# Patient Record
Sex: Male | Born: 1955 | Race: Black or African American | Hispanic: No | Marital: Married | Smoking: Current every day smoker
Health system: Southern US, Community
[De-identification: ages and names within clinical notes are randomized; demographics above are authoritative.]

## PROBLEM LIST (undated history)

## (undated) DIAGNOSIS — T7840XA Allergy, unspecified, initial encounter: Secondary | ICD-10-CM

## (undated) HISTORY — DX: Allergy, unspecified, initial encounter: T78.40XA

---

## 1984-03-08 HISTORY — PX: SHOULDER SURGERY: SHX246

## 2014-03-18 ENCOUNTER — Ambulatory Visit (INDEPENDENT_AMBULATORY_CARE_PROVIDER_SITE_OTHER): Payer: BLUE CROSS/BLUE SHIELD | Admitting: Physician Assistant

## 2014-03-18 VITALS — BP 132/82 | HR 60 | Temp 98.4°F | Resp 18 | Ht 67.5 in | Wt 177.0 lb

## 2014-03-18 DIAGNOSIS — G479 Sleep disorder, unspecified: Secondary | ICD-10-CM

## 2014-03-18 DIAGNOSIS — Z1211 Encounter for screening for malignant neoplasm of colon: Secondary | ICD-10-CM

## 2014-03-18 DIAGNOSIS — N508 Other specified disorders of male genital organs: Secondary | ICD-10-CM

## 2014-03-18 DIAGNOSIS — R079 Chest pain, unspecified: Secondary | ICD-10-CM

## 2014-03-18 DIAGNOSIS — Z13220 Encounter for screening for lipoid disorders: Secondary | ICD-10-CM

## 2014-03-18 DIAGNOSIS — M542 Cervicalgia: Secondary | ICD-10-CM

## 2014-03-18 DIAGNOSIS — Z125 Encounter for screening for malignant neoplasm of prostate: Secondary | ICD-10-CM

## 2014-03-18 DIAGNOSIS — R3589 Other polyuria: Secondary | ICD-10-CM

## 2014-03-18 DIAGNOSIS — Z0000 Encounter for general adult medical examination without abnormal findings: Secondary | ICD-10-CM

## 2014-03-18 DIAGNOSIS — H539 Unspecified visual disturbance: Secondary | ICD-10-CM

## 2014-03-18 DIAGNOSIS — Z130 Encounter for screening for diseases of the blood and blood-forming organs and certain disorders involving the immune mechanism: Secondary | ICD-10-CM

## 2014-03-18 DIAGNOSIS — Z6827 Body mass index (BMI) 27.0-27.9, adult: Secondary | ICD-10-CM

## 2014-03-18 DIAGNOSIS — R358 Other polyuria: Secondary | ICD-10-CM

## 2014-03-18 DIAGNOSIS — N5089 Other specified disorders of the male genital organs: Secondary | ICD-10-CM

## 2014-03-18 DIAGNOSIS — Z23 Encounter for immunization: Secondary | ICD-10-CM

## 2014-03-18 LAB — POCT CBC
Granulocyte percent: 62 %G (ref 37–80)
HCT, POC: 44.5 % (ref 43.5–53.7)
HEMOGLOBIN: 14.7 g/dL (ref 14.1–18.1)
LYMPH, POC: 2.9 (ref 0.6–3.4)
MCH: 29.7 pg (ref 27–31.2)
MCHC: 33 g/dL (ref 31.8–35.4)
MCV: 90 fL (ref 80–97)
MID (CBC): 0.2 (ref 0–0.9)
MPV: 8.3 fL (ref 0–99.8)
PLATELET COUNT, POC: 208 10*3/uL (ref 142–424)
POC GRANULOCYTE: 5 (ref 2–6.9)
POC LYMPH PERCENT: 36.1 %L (ref 10–50)
POC MID %: 1.9 %M (ref 0–12)
RBC: 4.95 M/uL (ref 4.69–6.13)
RDW, POC: 14.4 %
WBC: 8.1 10*3/uL (ref 4.6–10.2)

## 2014-03-18 LAB — COMPREHENSIVE METABOLIC PANEL
ALBUMIN: 4.4 g/dL (ref 3.5–5.2)
ALT: 28 U/L (ref 0–53)
AST: 28 U/L (ref 0–37)
Alkaline Phosphatase: 24 U/L — ABNORMAL LOW (ref 39–117)
BUN: 10 mg/dL (ref 6–23)
CHLORIDE: 108 meq/L (ref 96–112)
CO2: 24 mEq/L (ref 19–32)
Calcium: 9 mg/dL (ref 8.4–10.5)
Creat: 1.01 mg/dL (ref 0.50–1.35)
Glucose, Bld: 92 mg/dL (ref 70–99)
Potassium: 4.2 mEq/L (ref 3.5–5.3)
SODIUM: 138 meq/L (ref 135–145)
Total Bilirubin: 0.4 mg/dL (ref 0.2–1.2)
Total Protein: 6.9 g/dL (ref 6.0–8.3)

## 2014-03-18 LAB — LIPID PANEL
Cholesterol: 200 mg/dL (ref 0–200)
HDL: 48 mg/dL (ref >39)
LDL CALC: 137 mg/dL — AB (ref 0–99)
TRIGLYCERIDES: 77 mg/dL (ref <150)
Total CHOL/HDL Ratio: 4.2 Ratio
VLDL: 15 mg/dL (ref 0–40)

## 2014-03-18 LAB — TSH: TSH: 1.879 u[IU]/mL (ref 0.350–4.500)

## 2014-03-18 LAB — GLUCOSE, POCT (MANUAL RESULT ENTRY): POC Glucose: 96 mg/dl (ref 70–99)

## 2014-03-18 MED ORDER — MELOXICAM 15 MG PO TABS: 15.0000 mg | ORAL_TABLET | Freq: Every day | ORAL | Status: DC

## 2014-03-18 NOTE — Patient Instructions (Addendum)
I will contact you with your lab results as soon as they are available.   If you have not heard from me in 2 weeks, please contact me.  The fastest way to get your results is to register for My Chart (see the instructions on the last page of this printout).  The vision in your LEFT eye is diminished. You would benefit from a corrective lens. Please schedule with an eye specialist of your choice.  Seek dental care as soon as you are able. [employer] has a clinic for people without dental coverage. You may qualify for their services.  Keeping you healthy  Get these tests  Blood pressure- Have your blood pressure checked once a year by your healthcare provider.  Normal blood pressure is 120/80  Weight- Have your body mass index (BMI) calculated to screen for obesity.  BMI is a measure of body fat based on height and weight. You can also calculate your own BMI at [email or web address].  Cholesterol- Have your cholesterol checked every year.  Diabetes- Have your blood sugar checked regularly if you have high blood pressure, high cholesterol, have a family history of diabetes or if you are overweight.  Screening for Colon Cancer- Colonoscopy starting at age 40.  Screening may begin sooner depending on your family history and other health conditions. Follow up colonoscopy as directed by your Gastroenterologist.  Screening for Prostate Cancer- Both blood work (PSA) and a rectal exam help screen for Prostate Cancer.  Screening begins at age 59 with African-American men and at age 65 with Caucasian men.  Screening may begin sooner depending on your family history.  Take these medicines  Aspirin- One aspirin daily can help prevent Heart disease and Stroke.  Flu shot- Every fall.  Tetanus- Every 10 years.  Zostavax- Once after the age of 70 to prevent Shingles.  Pneumonia shot- Once after the age of 18; if you are younger than 76, ask your healthcare provider if you need  a Pneumonia shot.  Take these steps  Don't smoke- If you do smoke, talk to your doctor about quitting.  For tips on how to quit, go to www.smokefree.gov or call 1-800-QUIT-NOW.  Be physically active- Exercise 5 days a week for at least 30 minutes.  If you are not already physically active start slow and gradually work up to 30 minutes of moderate physical activity.  Examples of moderate activity include walking briskly, mowing the yard, dancing, swimming, bicycling, etc.  Eat a healthy diet- Eat a variety of healthy food such as fruits, vegetables, low fat milk, low fat cheese, yogurt, lean meant, poultry, fish, beans, tofu, etc. For more information go to www.thenutritionsource.org  Drink alcohol in moderation- Limit alcohol intake to less than two drinks a day. Never drink and drive.  Dentist- Brush and floss twice daily; visit your dentist twice a year.  Depression- Your emotional health is as important as your physical health. If you're feeling down, or losing interest in things you would normally enjoy please talk to your healthcare provider.  Eye exam- Visit your eye doctor every year.  Safe sex- If you may be exposed to a sexually transmitted infection, use a condom.  Seat belts- Seat belts can save your life; always wear one.  Smoke/Carbon Monoxide detectors- These detectors need to be installed on the appropriate level of your home.  Replace batteries at least once a year.  Skin cancer- When out in the sun, cover up and use sunscreen 15 SPF or  higher.  Violence- If anyone is threatening you, please tell your healthcare provider.  Living Will/ Health care power of attorney- Speak with your healthcare provider and family.

## 2014-03-18 NOTE — Progress Notes (Signed)
Subjective:    Patient ID: Mark Cherry, male    DOB: 1956/01/31, 59 y.o.   MRN: [id number]   PCP: No PCP Per Patient  Chief Complaint  Patient presents with  . Annual Exam    No Known Allergies  There are no active problems to display for this patient.   Prior to Admission medications   Not on File    Medical, Surgical, Family and Social History reviewed and updated.  HPI    Review of Systems  Constitutional: Positive for appetite change (i don't eat a lot of sweets, I don't like a lot of salt, I don't eat a lot of stuff like lettuce or broccoli. I like my food cooked. I can't eat a lot. I'm not a big eater.).  HENT: Positive for congestion, dental problem (Bad teeth. I inherited from my mom. one extraction 2 years ago. Can't afford additional work presently.), nosebleeds, rhinorrhea, sinus pressure and sneezing.        I constantly live with that every day. I've tried everything over the counter. After the first couple of days they don't work anymore.  Eyes: Positive for discharge, redness, itching and visual disturbance.       Eyes water a lot. Even with sunglasses. I just don't see as good as I used to. Near vision is blurry ("more than a foot in"). Wears reading glasses.  Respiratory: Positive for chest tightness (once in a while I get needles sticking right close to me where my heart is. for about 6 weeks. No squeezing, pressure, something sitting on him.).   Endocrine: Positive for heat intolerance, polydipsia (I drink a lot of tea. I mainly do that when I get home. It runs through me like water.) and polyuria (everything runs through me real quick.).  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  Allergic/Immunologic: Positive for  environmental allergies.  Neurological: Positive for weakness (RIGHT arm, associated with lots of lifting at work), numbness (RIGHT arm, associated with lots of lifting at work) and headaches (lots of looking up from his reach truck).  Psychiatric/Behavioral: Positive for sleep disturbance (some nights I can sleep good, go to bed early, like 7:30 pm, other nights unable to fall asleep until 3 am, and then I'm up again at 4:30am. 3 of 7 nights he has trouble.).       Objective:   Physical Exam  Constitutional: He is oriented to person, place, and time. Vital signs are normal. He appears well-developed and well-nourished. He is active and cooperative.  Non-toxic appearance. He does not have a sickly appearance. He does not appear ill. No distress.  BP 132/82 mmHg  Pulse 60  Temp(Src) 98.4 F (36.9 C) (Oral)  Resp 18  Ht 5' 7.5" (1.715 m)  Wt 177 lb (80.287 kg)  BMI 27.30 kg/m2  SpO2 98%   HENT:  Head: Normocephalic and atraumatic.  Right Ear: Hearing, tympanic membrane, external ear and ear canal normal.  Left Ear: Hearing, tympanic membrane, external ear and ear canal normal.  Nose: Nose normal.  Mouth/Throat: Uvula is midline, oropharynx is clear and moist and mucous membranes are normal. He does not have dentures. No oral lesions. No trismus in the jaw. Abnormal dentition (upper edentula; remaining lower teeth are in  poor repair). Dental caries present. No dental abscesses, uvula swelling or lacerations.  Eyes: Conjunctivae, EOM and lids are normal. Pupils are equal, round, and reactive to light. Right eye exhibits no discharge. Left eye exhibits no discharge. No scleral icterus.  Fundoscopic exam:      The right eye shows no arteriolar narrowing, no AV nicking, no exudate, no hemorrhage and no papilledema.       The left eye shows no arteriolar narrowing, no AV nicking, no exudate, no hemorrhage and no papilledema.  Visual Acuity in Right Eye - Without correction: 20/20-1  With  correction:  Visual Acuity in Left Eye - Without correction: 20/50  With correction:  Visual Acuity in Both Eyes - Without correction: 20/20-1  With correction:     Neck: Normal range of motion, full passive range of motion without pain and phonation normal. Neck supple. No spinous process tenderness and no muscular tenderness present. No rigidity. No tracheal deviation, no edema, no erythema and normal range of motion present. No thyromegaly present.  Cardiovascular: Normal rate, regular rhythm, S1 normal, S2 normal, normal heart sounds, intact distal pulses and normal pulses.  Exam reveals no gallop and no friction rub.   No murmur heard. Pulmonary/Chest: Effort normal and breath sounds normal. No respiratory distress. He has no wheezes. He has no rales.  Abdominal: Soft. Normal appearance and bowel sounds are normal. He exhibits no distension and no mass. There is no hepatosplenomegaly. There is no tenderness. There is no rebound and no guarding. No hernia. Hernia confirmed negative in the right inguinal area and confirmed negative in the left inguinal area.  Genitourinary: Rectum normal, prostate normal and penis normal. Right testis shows mass (above the RIGHT testicle). Right testis shows no swelling and no tenderness. Right testis is descended. Cremasteric reflex is not absent on the right side. Left testis shows no mass, no swelling and no tenderness. Left testis is descended. Cremasteric reflex is not absent on the left side. Circumcised. No phimosis, paraphimosis, hypospadias, penile erythema or penile tenderness. No discharge found.  Musculoskeletal: Normal range of motion. He exhibits no edema.       Right shoulder: He exhibits tenderness, bony tenderness (anteriorly and laterally to palpation) and spasm. He exhibits normal range of motion, no swelling, no effusion, no crepitus, no deformity, no laceration, no pain, normal pulse and normal strength.       Left shoulder: Normal.       Right  elbow: Normal.      Left elbow: Normal.       Right wrist: Normal.       Left wrist: Normal.       Right hip: Normal.       Left hip: Normal.       Right knee: Normal.       Left knee: Normal.       Right ankle: Normal. Achilles tendon normal.       Left ankle: Normal. Achilles tendon normal.       Cervical back: Normal. He exhibits normal range of motion, no tenderness, no bony tenderness, no swelling, no edema, no deformity, no laceration, no pain, no spasm and normal pulse.       Thoracic back: Normal.       Lumbar back: Normal.       Right upper arm: Normal.       Left upper arm: Normal.       Right forearm: Normal.       Left forearm:  Normal.       Arms:      Right hand: Normal.       Left hand: Normal.       Right upper leg: Normal.       Left upper leg: Normal.       Right lower leg: Normal.       Left lower leg: Normal.       Right foot: Normal.       Left foot: Normal.  Lymphadenopathy:       Head (right side): No submental, no submandibular, no tonsillar, no preauricular, no posterior auricular and no occipital adenopathy present.       Head (left side): No submental, no submandibular, no tonsillar, no preauricular, no posterior auricular and no occipital adenopathy present.    He has no cervical adenopathy.       Right: No inguinal and no supraclavicular adenopathy present.       Left: No inguinal and no supraclavicular adenopathy present.  Neurological: He is alert and oriented to person, place, and time. He has normal strength and normal reflexes. He displays no tremor. No cranial nerve deficit or sensory deficit. He exhibits normal muscle tone. Coordination and gait normal.  Skin: Skin is warm, dry and intact. No abrasion, no ecchymosis, no laceration, no lesion and no rash noted. He is not diaphoretic. No cyanosis or erythema. No pallor. Nails show no clubbing.  Psychiatric: He has a normal mood and affect. His speech is normal and behavior is normal. Judgment and  thought content normal. Cognition and memory are normal.      Results for orders placed or performed in visit on 03/18/14  POCT CBC  Result Value Ref Range   WBC 8.1 4.6 - 10.2 K/uL   Lymph, poc 2.9 0.6 - 3.4   POC LYMPH PERCENT 36.1 10 - 50 %L   MID (cbc) 0.2 0 - 0.9   POC MID % 1.9 0 - 12 %M   POC Granulocyte 5.0 2 - 6.9   Granulocyte percent 62.0 37 - 80 %G   RBC 4.95 4.69 - 6.13 M/uL   Hemoglobin 14.7 14.1 - 18.1 g/dL   HCT, POC 62.144.5 30.843.5 - 53.7 %   MCV 90.0 80 - 97 fL   MCH, POC 29.7 27 - 31.2 pg   MCHC 33.0 31.8 - 35.4 g/dL   RDW, POC 65.714.4 %   Platelet Count, POC 208 142 - 424 K/uL   MPV 8.3 0 - 99.8 fL  POCT glucose (manual entry)  Result Value Ref Range   POC Glucose 96 70 - 99 mg/dl   EKG reviewed with Dr. Merla Richesoolittle. Sinus bradycardia with a rate of 50. Low voltage, probably normal.    Assessment & Plan:  1. Annual physical exam Age appropriate anticipatory guidance provided.  2. BMI 27.0-27.9,adult Healthy eating and regular exercise.  3. Screening for prostate cancer - PSA  4. Screening for colon cancer - Ambulatory referral to Gastroenterology  5. Screening for iron deficiency anemia Normal CBC. - POCT CBC  6. Screening for hyperlipidemia Await lab results. Healthy eating, regular exercise. - Lipid panel  7. Chest pain, unspecified chest pain type Smoker, over 50. While this CP isn't typical for cardiac etiologies, recommend cardiology evaluation  - EKG 12-Lead - Ambulatory referral to Cardiology  8. Polyuria No evidence of infection or diabetes. Await CMET. Consider BPH causing symptoms. - POCT glucose (manual entry) - Comprehensive metabolic panel  9. Neck pain Trial of meloxicam.  Re-evaluate in 6 weeks. Plan C-spine films due to RIGHT arm paresthesias. - meloxicam (MOBIC) 15 MG tablet; Take 1 tablet (15 mg total) by mouth daily.  Dispense: 30 tablet; Refill: 1  10. Vision changes Reduced far vision on the LEFT. Recommend eye specialist  evaluation.  11. Need for Tdap vaccination - Tdap vaccine greater than or equal to 7yo IM  12. Scrotal mass Likely benign. - US Scrotum; Future  13. Sleep disturbance Await TSH. Consider PRN sleep aid if TSH normal. - TSH   Fernande Bras, PA-C Physician Assistant-Certified [facility]

## 2014-03-19 LAB — PSA: PSA: 0.56 ng/mL (ref <=4.00)

## 2014-03-20 ENCOUNTER — Encounter: Payer: Self-pay | Admitting: Physician Assistant

## 2014-03-21 ENCOUNTER — Other Ambulatory Visit: Payer: Self-pay

## 2014-03-25 ENCOUNTER — Ambulatory Visit
Admission: RE | Admit: 2014-03-25 | Discharge: 2014-03-25 | Disposition: A | Discharge status: Home / Self Care | Payer: BLUE CROSS/BLUE SHIELD | Source: Ambulatory Visit | Attending: Physician Assistant | Admitting: Physician Assistant

## 2014-03-25 DIAGNOSIS — N5089 Other specified disorders of the male genital organs: Secondary | ICD-10-CM

## 2014-04-15 NOTE — Progress Notes (Signed)
Patient ID: Mark Cherry, male   DOB: [birth year], 57 y.o.   MRN: [id number]    Cardiology Office Note   Date:  04/16/2014   ID:  Mark Cherry, DOB 09-Aug-1955, MRN [id number]  PCP:  No PCP Per Patient  Cardiologist:   Charlton Haws, MD   No chief complaint on file.     History of Present Illness: Mark Cherry is a 59 y.o. male who presents for  Atypical chest pain by PA Chelle Seen for annual on 03/18/14.  Complained of chest tightness (once in a while I get needles sticking right close to me where my heart is. for about 6 weeks. 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  No previoius CAD  Not getting pain outside of coughing.  Mild exertional dyspnea  Needs CXR    LABS 03/18/14  LDL 137 normal LFT;s Cr PSA and TSH      Past Medical History  Diagnosis Date  . Allergy     Past Surgical History  Procedure Laterality Date  . Shoulder surgery Left 1986    reconstruction after traumatic injury     Current Outpatient Prescriptions  Medication Sig Dispense Refill  . meloxicam (MOBIC) 15 MG tablet Take 1 tablet (15 mg total) by mouth daily. 30 tablet 1   No current facility-administered medications for this visit.    Allergies:   Review of patient's allergies indicates no known allergies.    Social History:  The patient  reports that he has been smoking Cigarettes.  He has a 15 pack-year smoking history. He has never used smokeless tobacco. He reports that he drinks about 1.8 oz of alcohol per week. He reports that he does not use illicit drugs.   Family History:  The patient's family history includes Arthritis in his brother and brother; Diabetes in his brother, maternal grandfather, and mother; Gout in his brother; Hyperlipidemia in his brother;  Hypertension in his brother; Sickle cell anemia in his brother, sister, and sister.    ROS:  Please see the history of present illness.   Otherwise, review of systems are positive for none.   All other systems are reviewed and negative.    PHYSICAL EXAM: VS:  BP 110/70 mmHg  Pulse 56  Ht  (1.702 m)  Wt 80.346 kg (177 lb 2.1 oz)  BMI 27.74 kg/m2  SpO2 97% , BMI Body mass index is 27.74 kg/(m^2). GEN: Well nourished, well developed, in no acute distress HEENT: normal Neck: no JVD, carotid bruits, or masses Cardiac: RRR; no murmurs, rubs, or gallops,no edema  Respiratory:  clear to auscultation bilaterally, normal work of breathing GI: soft, nontender, nondistended, + BS MS: no deformity or atrophy Skin: warm and dry, no rash Neuro:  Strength and sensation are intact Psych: euthymic mood, full affect   EKG:  03/18/14  SR rate 50  Early repol precordial leads insig Q waves inferior leads  04/16/14  SR rate 56  RAD    Recent Labs: 03/18/2014: ALT 28; BUN 10; Creatinine 1.01; Hemoglobin 14.7; Potassium 4.2; Sodium 138; TSH 1.879    Lipid Panel    Component Value Date/Time   CHOL 200 03/18/2014 1100   TRIG 77 03/18/2014 1100   HDL 48 03/18/2014 1100  CHOLHDL 4.2 03/18/2014 1100   VLDL 15 03/18/2014 1100   LDLCALC 137* 03/18/2014 1100      Wt Readings from Last 3 Encounters:  04/16/14 80.346 kg (177 lb 2.1 oz)  03/18/14 80.287 kg (177 lb)      Other studies Reviewed: Additional studies/ records that were reviewed today include: Epic records.    ASSESSMENT AND PLAN:  1.  Chest Pain:  Atypical related to cough  CRF;s including smoking ECG normal F/U ETT since he uses heavy machinery at work 2. Smoking counseled for less than 10 minutes  CXR today   3.  Sinus:  Gave him name of Dr Jearld FentonByers and Annalee GentaShoemaker Discussed Nettie Pot saline washes.  Has f/u with primary in [place]    Current medicines are reviewed at length with the patient today.  The patient does not have  concerns regarding medicines.  The following changes have been made:  no change  Labs/ tests ordered today include: ETT CXR  Orders Placed This Encounter  Procedures  . DG Chest 2 View  . EKG 12-Lead  . Exercise Tolerance Test     Disposition:   FU with me PRN   Signed, Charlton HawsPeter Mikaylee Arseneau, MD  04/16/2014 12:00 PM    [facility] [address], [place], [place]  [id number] Phone: [phone]; Fax: [phone]

## 2014-04-16 ENCOUNTER — Ambulatory Visit (INDEPENDENT_AMBULATORY_CARE_PROVIDER_SITE_OTHER): Payer: BLUE CROSS/BLUE SHIELD | Admitting: Cardiovascular Disease

## 2014-04-16 ENCOUNTER — Ambulatory Visit (INDEPENDENT_AMBULATORY_CARE_PROVIDER_SITE_OTHER)
Admission: RE | Admit: 2014-04-16 | Discharge: 2014-04-16 | Disposition: A | Discharge status: Home / Self Care | Payer: BLUE CROSS/BLUE SHIELD | Source: Ambulatory Visit | Attending: Cardiovascular Disease | Admitting: Cardiovascular Disease

## 2014-04-16 ENCOUNTER — Encounter: Payer: Self-pay | Admitting: Cardiovascular Disease

## 2014-04-16 VITALS — BP 110/70 | HR 56 | Ht 67.0 in | Wt 177.1 lb

## 2014-04-16 DIAGNOSIS — Z720 Tobacco use: Secondary | ICD-10-CM

## 2014-04-16 DIAGNOSIS — F17200 Nicotine dependence, unspecified, uncomplicated: Secondary | ICD-10-CM

## 2014-04-16 DIAGNOSIS — R079 Chest pain, unspecified: Secondary | ICD-10-CM

## 2014-04-16 NOTE — Patient Instructions (Signed)
Your physician recommends that you schedule a follow-up appointment in: AS NEEDED  Your physician recommends that you continue on your current medications as directed. Please refer to the Current Medication list given to you today. A chest x-ray takes a picture of the organs and structures inside the chest, including the heart, lungs, and blood vessels. This test can show several things, including, whether the heart is enlarges; whether fluid is building up in the lungs; and whether pacemaker / defibrillator leads are still in place. Your physician has requested that you have an exercise tolerance test. For further information please visit [email or web address]. Please also follow instruction sheet, as given.  

## 2014-05-16 ENCOUNTER — Encounter: Payer: BLUE CROSS/BLUE SHIELD | Admitting: Physician Assistant

## 2014-06-13 ENCOUNTER — Telehealth (HOSPITAL_COMMUNITY): Payer: Self-pay

## 2014-06-13 NOTE — Telephone Encounter (Signed)
Encounter complete. 

## 2014-06-18 ENCOUNTER — Inpatient Hospital Stay (HOSPITAL_COMMUNITY): Admission: RE | Admit: 2014-06-18 | Payer: BLUE CROSS/BLUE SHIELD | Source: Ambulatory Visit

## 2014-11-18 DIAGNOSIS — J302 Other seasonal allergic rhinitis: Secondary | ICD-10-CM | POA: Insufficient documentation

## 2014-11-18 DIAGNOSIS — J3089 Other allergic rhinitis: Secondary | ICD-10-CM

## 2015-07-28 DIAGNOSIS — M75121 Complete rotator cuff tear or rupture of right shoulder, not specified as traumatic: Secondary | ICD-10-CM | POA: Insufficient documentation

## 2016-04-04 ENCOUNTER — Encounter (HOSPITAL_BASED_OUTPATIENT_CLINIC_OR_DEPARTMENT_OTHER): Payer: Self-pay | Admitting: Emergency Medicine

## 2016-04-04 ENCOUNTER — Emergency Department (HOSPITAL_BASED_OUTPATIENT_CLINIC_OR_DEPARTMENT_OTHER)
Admission: EM | Admit: 2016-04-04 | Discharge: 2016-04-05 | Disposition: A | Discharge status: Home / Self Care | Payer: BLUE CROSS/BLUE SHIELD | Attending: Emergency Medicine | Admitting: Emergency Medicine

## 2016-04-04 ENCOUNTER — Emergency Department (HOSPITAL_BASED_OUTPATIENT_CLINIC_OR_DEPARTMENT_OTHER): Payer: BLUE CROSS/BLUE SHIELD

## 2016-04-04 DIAGNOSIS — M791 Myalgia: Secondary | ICD-10-CM | POA: Insufficient documentation

## 2016-04-04 DIAGNOSIS — R05 Cough: Secondary | ICD-10-CM | POA: Diagnosis present

## 2016-04-04 DIAGNOSIS — R509 Fever, unspecified: Secondary | ICD-10-CM | POA: Insufficient documentation

## 2016-04-04 DIAGNOSIS — R0981 Nasal congestion: Secondary | ICD-10-CM | POA: Insufficient documentation

## 2016-04-04 DIAGNOSIS — F17210 Nicotine dependence, cigarettes, uncomplicated: Secondary | ICD-10-CM | POA: Insufficient documentation

## 2016-04-04 DIAGNOSIS — J111 Influenza due to unidentified influenza virus with other respiratory manifestations: Secondary | ICD-10-CM

## 2016-04-04 DIAGNOSIS — R51 Headache: Secondary | ICD-10-CM | POA: Insufficient documentation

## 2016-04-04 DIAGNOSIS — Z79899 Other long term (current) drug therapy: Secondary | ICD-10-CM | POA: Diagnosis not present

## 2016-04-04 DIAGNOSIS — R69 Illness, unspecified: Secondary | ICD-10-CM

## 2016-04-04 MED ORDER — BENZONATATE 100 MG PO CAPS: 100.0000 mg | ORAL_CAPSULE | Freq: Three times a day (TID) | ORAL | 0 refills | Status: DC

## 2016-04-04 MED ORDER — PROCHLORPERAZINE MALEATE 10 MG PO TABS: 10.0000 mg | ORAL_TABLET | Freq: Two times a day (BID) | ORAL | 0 refills | Status: DC | PRN

## 2016-04-04 NOTE — ED Triage Notes (Signed)
Patient reports that he has had dry cough with body aches x 2 days. He has a headache with the cough

## 2016-04-04 NOTE — ED Provider Notes (Signed)
[facility] Provider Note   CSN: [id number] Arrival date & time: 04/04/16  1842  By signing my name below, I, Modena JanskyAlbert Thayil, attest that this documentation has been prepared under the direction and in the presence of Alvira MondayErin Steffani Dionisio, MD. Electronically Signed: Modena JanskyAlbert Thayil, Scribe. 04/04/2016. 8:12 PM.  History   Chief Complaint Chief Complaint  Patient presents with  . Cough   The history is provided by the patient. No language interpreter was used.   HPI Comments: Mark Cherry is a 61 y.o. male with a PMHx of sinus problems who presents to the Emergency Department complaining of intermittent moderate cough that started today. He took cough syrup, nyquil, and amoxicillin without relief. He describes the cough as dry. He reports associated symptoms of right frontal headache (worse with cough), sinus pain/pressure, nasal congestion, sore throat, generalized myalgias, and subjective fever. Pt's temperature in the ED today was 99.5. He denies any SOB, nausea, vomiting, diarrhea, or visual disturbance.   Past Medical History:  Diagnosis Date  . Allergy     There are no active problems to display for this patient.   Past Surgical History:  Procedure Laterality Date  . SHOULDER SURGERY Left 1986   reconstruction after traumatic injury       Home Medications    Prior to Admission medications   Medication Sig Start Date End Date Taking? Authorizing Provider  dextromethorphan-guaiFENesin (MUCINEX DM) 30-600 MG 12hr tablet Take 1 tablet by mouth 2 (two) times daily.   Yes Historical Provider, MD  benzonatate (TESSALON) 100 MG capsule Take 1 capsule (100 mg total) by mouth every 8 (eight) hours. 04/04/16   Alvira MondayErin Eleen Litz, MD  meloxicam (MOBIC) 15 MG tablet Take 1 tablet (15 mg total) by mouth daily. 03/18/14   Chelle Jeffery, PA-C  prochlorperazine (COMPAZINE) 10 MG tablet Take 1 tablet (10 mg total) by mouth 2 (two) times daily as needed for nausea or vomiting  (headache.  Take with 25mg  of benadryl to help with side effects.). 04/04/16   Alvira MondayErin Jamie Hafford, MD    Family History Family History  Problem Relation Age of Onset  . Diabetes Mother   . Diabetes Brother   . Hyperlipidemia Brother   . Gout Brother   . Hypertension Brother   . Diabetes Maternal Grandfather   . Sickle cell anemia Sister   . Arthritis Brother   . Arthritis Brother   . Sickle cell anemia Brother   . Sickle cell anemia Sister     Social History Social History  Substance Use Topics  . Smoking status: Current Every Day Smoker    Packs/day: 0.50    Years: 30.00    Types: Cigarettes  . Smokeless tobacco: Never Used     Comment: "I've been working on it, but it's the job I have."  . Alcohol use 1.8 oz/week    3 Standard drinks or equivalent per week     Allergies   Patient has no known allergies.   Review of Systems Review of Systems  Constitutional: Positive for fever (Subjective).  HENT: Positive for congestion (Nasal), sinus pain and sinus pressure. Negative for sore throat.   Eyes: Negative for visual disturbance.  Respiratory: Negative for shortness of breath.   Cardiovascular: Negative for chest pain.  Gastrointestinal: Negative for abdominal pain, diarrhea, nausea and vomiting.  Genitourinary: Negative for difficulty urinating.  Musculoskeletal: Positive for myalgias (Generalized). Negative for back pain and neck stiffness.  Skin: Negative for rash.  Neurological: Positive for headaches (Right frontal). Negative  for syncope.     Physical Exam Updated Vital Signs BP 139/79 (BP Location: Right Arm)   Pulse 78   Temp 99.5 F (37.5 C) (Oral)   Resp 21   SpO2 98%   Physical Exam  Constitutional: He is oriented to person, place, and time. He appears well-developed and well-nourished. No distress.  HENT:  Head: Normocephalic and atraumatic.  Right Ear: Tympanic membrane normal.  Left Ear: Tympanic membrane normal.  No sinus TTP. Nasal discharge.  Small clear effusion r TM  Eyes: Conjunctivae and EOM are normal. Pupils are equal, round, and reactive to light.  Neck: Normal range of motion. Neck supple.  Cardiovascular: Normal rate, regular rhythm, normal heart sounds and intact distal pulses.  Exam reveals no gallop and no friction rub.   No murmur heard. Pulmonary/Chest: Effort normal and breath sounds normal. No respiratory distress. He has no wheezes. He has no rales.  Abdominal: Soft. He exhibits no distension. There is no tenderness. There is no guarding.  Musculoskeletal: Normal range of motion. He exhibits no edema.  Neurological: He is alert and oriented to person, place, and time.  Skin: Skin is warm and dry. He is not diaphoretic.  Psychiatric: He has a normal mood and affect.  Nursing note and vitals reviewed.    ED Treatments / Results  DIAGNOSTIC STUDIES: Oxygen Saturation is 98% on RA, normal by my interpretation.    COORDINATION OF CARE: 8:16 PM- Pt advised of plan for treatment and pt agrees.  Labs (all labs ordered are listed, but only abnormal results are displayed) Labs Reviewed - No data to display  EKG  EKG Interpretation None       Radiology Dg Chest 2 View  Result Date: 04/04/2016 CLINICAL DATA:  Dry cough and body aches 2 days. EXAM: CHEST  2 VIEW COMPARISON:  04/16/2014 FINDINGS: Lungs are adequately inflated without focal consolidation or effusion. Cardiomediastinal silhouette and remainder the exam is unchanged. IMPRESSION: No active cardiopulmonary disease. Electronically Signed   By: Elberta Fortis M.D.   On: 04/04/2016 19:06    Procedures Procedures (including critical care time)  Medications Ordered in ED Medications - No data to display   Initial Impression / Assessment and Plan / ED Course  I have reviewed the triage vital signs and the nursing notes.  Pertinent labs & imaging results that were available during my care of the patient were reviewed by me and considered in my  medical decision making (see chart for details).     61yo male with history of smoking presents with concern for cough, congestion, subjective fever, boy aches, headache.  No sign of meningitis. No sign of bacterial sinusitis by duration, exam. Doubt SAH/subdural by hx.  CXR without pneumonia.  Suspect influenza by history. Discussed possible tamiflu, however given side effects/cost, pt without other medical problems, pt declines and will focus on supportive care. Gave compazine rx for headache, tessalon for cough. Patient discharged in stable condition with understanding of reasons to return.   Final Clinical Impressions(s) / ED Diagnoses   Final diagnoses:  Influenza-like illness    New Prescriptions Discharge Medication List as of 04/04/2016  8:28 PM    START taking these medications   Details  prochlorperazine (COMPAZINE) 10 MG tablet Take 1 tablet (10 mg total) by mouth 2 (two) times daily as needed for nausea or vomiting (headache.  Take with 25mg  of benadryl to help with side effects.)., Starting Sun 04/04/2016, Print       I personally  performed the services described in this documentation, which was scribed in my presence. The recorded information has been reviewed and is accurate.    Alvira Monday, MD 04/05/16 1324

## 2016-04-25 DIAGNOSIS — E7800 Pure hypercholesterolemia, unspecified: Secondary | ICD-10-CM | POA: Insufficient documentation

## 2017-03-22 ENCOUNTER — Ambulatory Visit (INDEPENDENT_AMBULATORY_CARE_PROVIDER_SITE_OTHER): Payer: BLUE CROSS/BLUE SHIELD | Admitting: Physician Assistant

## 2017-03-22 ENCOUNTER — Other Ambulatory Visit: Payer: Self-pay

## 2017-03-22 ENCOUNTER — Encounter: Payer: Self-pay | Admitting: Physician Assistant

## 2017-03-22 VITALS — BP 120/84 | HR 78 | Temp 98.8°F | Resp 18 | Ht 67.0 in | Wt 178.4 lb

## 2017-03-22 DIAGNOSIS — E7800 Pure hypercholesterolemia, unspecified: Secondary | ICD-10-CM | POA: Diagnosis not present

## 2017-03-22 DIAGNOSIS — R195 Other fecal abnormalities: Secondary | ICD-10-CM | POA: Diagnosis not present

## 2017-03-22 DIAGNOSIS — Z130 Encounter for screening for diseases of the blood and blood-forming organs and certain disorders involving the immune mechanism: Secondary | ICD-10-CM | POA: Diagnosis not present

## 2017-03-22 DIAGNOSIS — Z1389 Encounter for screening for other disorder: Secondary | ICD-10-CM

## 2017-03-22 DIAGNOSIS — F17200 Nicotine dependence, unspecified, uncomplicated: Secondary | ICD-10-CM | POA: Diagnosis not present

## 2017-03-22 DIAGNOSIS — Z13228 Encounter for screening for other metabolic disorders: Secondary | ICD-10-CM | POA: Diagnosis not present

## 2017-03-22 DIAGNOSIS — Z125 Encounter for screening for malignant neoplasm of prostate: Secondary | ICD-10-CM

## 2017-03-22 DIAGNOSIS — Z1329 Encounter for screening for other suspected endocrine disorder: Secondary | ICD-10-CM | POA: Diagnosis not present

## 2017-03-22 DIAGNOSIS — Z1159 Encounter for screening for other viral diseases: Secondary | ICD-10-CM | POA: Diagnosis not present

## 2017-03-22 DIAGNOSIS — Z114 Encounter for screening for human immunodeficiency virus [HIV]: Secondary | ICD-10-CM | POA: Diagnosis not present

## 2017-03-22 DIAGNOSIS — Z0000 Encounter for general adult medical examination without abnormal findings: Secondary | ICD-10-CM | POA: Diagnosis not present

## 2017-03-22 DIAGNOSIS — Z23 Encounter for immunization: Secondary | ICD-10-CM

## 2017-03-22 NOTE — Progress Notes (Signed)
Patient ID: Mark Cherry Arechiga, male    DOB: 01/10/1956, 62 y.o.   MRN: [id number]  PCP: Patient, No Pcp Per  Chief Complaint  Patient presents with  . Annual Exam    Subjective:   [facility]. He is new to this practice. Chart reviewed, including Care Everywhere. Last wellness visit 02/2015.  Colorectal Cancer Screening: POSITIVE fecal occult test in 2016 and was referred to GI for colonoscopy, but he never scheduled. Prostate Cancer Screening: normal 03/18/2014 Bone Density Testing: not yet a candidate HIV Screening: today STI Screening: declines Seasonal Influenza Vaccination: declines, because when he received it in 2000, he was ill for 2 weeks following the dose. Td/Tdap Vaccination: 03/18/2014 Pneumococcal Vaccination: 02/25/2015, pneumovax 23. Booster dose at age 62. Zoster Vaccination: not yet a candidate Frequency of Dental evaluation: not recently, due to lack of dental coverage. Plans to schedule soon. Frequency of Eye evaluation: >12 months ago   Patient Active Problem List   Diagnosis Date Noted  . Occult blood in stools 03/22/2017  . Complete tear of right rotator cuff 07/28/2015  . Perennial allergic rhinitis with seasonal variation 11/18/2014    Past Medical History:  Diagnosis Date  . Allergy      Prior to Admission medications   Medication Sig Start Date End Date Taking? Authorizing Provider  fluticasone (FLONASE) 50 MCG/ACT nasal spray 1 spray by Each Nare route daily.   Yes [provider]  benzonatate (TESSALON) 100 MG capsule Take 1 capsule (100 mg total) by mouth every 8 (eight) hours. Patient not taking: Reported on 03/22/2017 04/04/16   Alvira MondaySchlossman, Erin, MD  dextromethorphan-guaiFENesin [facility](MUCINEX DM) 30-600 MG 12hr tablet Take 1 tablet by mouth 2 (two) times daily.    [provider]  meloxicam (MOBIC) 15 MG tablet Take 1 tablet (15 mg total) by mouth daily. Patient not taking: Reported on 03/22/2017 03/18/14    Porfirio OarJeffery, Larina Lieurance, PA-C  prochlorperazine (COMPAZINE) 10 MG tablet Take 1 tablet (10 mg total) by mouth 2 (two) times daily as needed for nausea or vomiting (headache.  Take with 25mg  of benadryl to help with side effects.). Patient not taking: Reported on 03/22/2017 04/04/16   Alvira MondaySchlossman, Erin, MD    No Known Allergies  Past Surgical History:  Procedure Laterality Date  . SHOULDER SURGERY Left 1986   reconstruction after traumatic injury    Family History  Problem Relation Age of Onset  . Diabetes Mother   . Diabetes Brother   . Hyperlipidemia Brother   . Gout Brother   . Hypertension Brother   . Diabetes Maternal Grandfather   . Sickle cell anemia Sister   . Arthritis Brother   . Arthritis Brother   . Sickle cell anemia Brother   . Sickle cell anemia Sister     Social History   Socioeconomic History  . Marital status: Legally Separated    Spouse name: n/a  . Number of children: 4  . Years of education: 12th grade  . Highest education level: None  Social Needs  . Financial resource strain: None  . Food insecurity - worry: None  . Food insecurity - inability: None  . Transportation needs - medical: None  . Transportation needs - non-medical: None  Occupational History  . Occupation: Estate agentforklift operator  Tobacco Use  . Smoking status: Current Every Day Smoker    Packs/day: 0.50    Years: 30.00    Pack years: 15.00    Types: Cigarettes  . Smokeless tobacco: Never  Used  . Tobacco comment: "I've been working on it, but it's the job I have."  Substance and Sexual Activity  . Alcohol use: Yes    Alcohol/week: 1.8 oz    Types: 3 Standard drinks or equivalent per week  . Drug use: No  . Sexual activity: Yes    Partners: Female  Other Topics Concern  . None  Social History Narrative   Lives with his fiance.   His younger son lives with him sometimes/intermittently.   Eldest son is currently incarcerated.   One daughter lives with her mother.   One daughter  independently in [facility].   He has 7 grandchildren.       Review of Systems  Constitutional: Negative.   HENT: Positive for congestion (uses Flonase). Negative for dental problem, drooling, ear discharge, ear pain, facial swelling, hearing loss, mouth sores, nosebleeds, postnasal drip, rhinorrhea, sinus pressure, sinus pain, sneezing, sore throat, tinnitus, trouble swallowing and voice change.   Eyes: Positive for itching (uses clear eyes tears with relief). Negative for photophobia, pain, discharge, redness and visual disturbance.  Respiratory: Negative.   Cardiovascular: Negative.   Gastrointestinal: Negative.   Endocrine: Negative.   Genitourinary: Positive for frequency. Negative for decreased urine volume, difficulty urinating, discharge, dysuria, enuresis, flank pain, genital sores, hematuria, penile pain, penile swelling, scrotal swelling, testicular pain and urgency.  Musculoskeletal: Negative.        Muscle cramps at work in hot environment.  Skin: Negative.   Allergic/Immunologic: Negative.   Neurological: Negative.   Hematological: Negative.   Psychiatric/Behavioral: Negative.         Objective:  Physical Exam  Constitutional: He is oriented to person, place, and time. He appears well-developed and well-nourished. He is active and cooperative.  Non-toxic appearance. He does not have a sickly appearance. He does not appear ill. No distress.  BP 120/84 (BP Location: Left Arm, Patient Position: Sitting, Cuff Size: Normal)   Pulse 78   Temp 98.8 F (37.1 C) (Oral)   Resp 18   Ht 5\' 7"  (1.702 m)   Wt 178 lb 6.4 oz (80.9 kg)   SpO2 98%   BMI 27.94 kg/m    HENT:  Head: Normocephalic and atraumatic.  Right Ear: Hearing, tympanic membrane, external ear and ear canal normal.  Left Ear: Hearing, tympanic membrane, external ear and ear canal normal.  Nose: Nose normal.  Mouth/Throat: Uvula is midline, oropharynx is clear and moist and mucous membranes are normal. He  does not have dentures. No oral lesions. No trismus in the jaw. Normal dentition. No dental abscesses, uvula swelling, lacerations or dental caries.  Eyes: Conjunctivae, EOM and lids are normal. Pupils are equal, round, and reactive to light. Right eye exhibits no discharge. Left eye exhibits no discharge. No scleral icterus.  Fundoscopic exam:      The right eye shows no arteriolar narrowing, no AV nicking, no exudate, no hemorrhage and no papilledema.       The left eye shows no arteriolar narrowing, no AV nicking, no exudate, no hemorrhage and no papilledema.  Neck: Normal range of motion, full passive range of motion without pain and phonation normal. Neck supple. No spinous process tenderness and no muscular tenderness present. No neck rigidity. No tracheal deviation, no edema, no erythema and normal range of motion present. No thyromegaly present.  Cardiovascular: Normal rate, regular rhythm, S1 normal, S2 normal, normal heart sounds, intact distal pulses and normal pulses. Exam reveals no gallop and no friction rub.  No murmur heard. Pulmonary/Chest: Effort normal and breath sounds normal. No respiratory distress. He has no wheezes. He has no rales.  Abdominal: Soft. Normal appearance and bowel sounds are normal. He exhibits no distension and no mass. There is no hepatosplenomegaly. There is no tenderness. There is no rebound and no guarding. No hernia.  Musculoskeletal: Normal range of motion. He exhibits no edema or tenderness.       Cervical back: Normal. He exhibits normal range of motion, no tenderness, no bony tenderness, no swelling, no edema, no deformity, no laceration, no pain, no spasm and normal pulse.       Thoracic back: Normal.       Lumbar back: Normal.  Lymphadenopathy:       Head (right side): No submental, no submandibular, no tonsillar, no preauricular, no posterior auricular and no occipital adenopathy present.       Head (left side): No submental, no submandibular, no  tonsillar, no preauricular, no posterior auricular and no occipital adenopathy present.    He has no cervical adenopathy.       Right: No supraclavicular adenopathy present.       Left: No supraclavicular adenopathy present.  Neurological: He is alert and oriented to person, place, and time. He has normal strength and normal reflexes. He displays no tremor. No cranial nerve deficit. He exhibits normal muscle tone. Coordination and gait normal.  Skin: Skin is warm, dry and intact. No abrasion, no ecchymosis, no laceration, no lesion and no rash noted. He is not diaphoretic. No cyanosis or erythema. No pallor. Nails show no clubbing.  Psychiatric: He has a normal mood and affect. His speech is normal and behavior is normal. Judgment and thought content normal. Cognition and memory are normal.    Wt Readings from Last 3 Encounters:  03/22/17 178 lb 6.4 oz (80.9 kg)  04/16/14 177 lb 2.1 oz (80.3 kg)  03/18/14 177 lb (80.3 kg)     Visual Acuity Screening   Right eye Left eye Both eyes  Without correction: 20/25 20/40 20/20   With correction:         Assessment & Plan:   Problem List Items Addressed This Visit    Occult blood in stools    Stressed the importance of colonoscopy to further evaluate history of heme positive stools. Referral to GI.      Relevant Orders   Ambulatory referral to Gastroenterology   Hypercholesterolemia    LDL was 137 in 2016. Update today.      Relevant Orders   Lipid panel (Completed)   Smoker    Smoking cessation encouraged. He is not ready.       Other Visit Diagnoses    Annual physical exam    -  Primary   Age appropriate health guidance provided   Screening for deficiency anemia       Relevant Orders   CBC with Differential/Platelet (Completed)   Need for hepatitis C screening test       Relevant Orders   Hepatitis C antibody (Completed)   Screening for HIV (human immunodeficiency virus)       Relevant Orders   HIV antibody (Completed)    Screening for metabolic disorder       Relevant Orders   Comprehensive metabolic panel (Completed)   Screening for thyroid disorder       Relevant Orders   TSH (Completed)   Screening for blood or protein in urine       Relevant Orders  Urinalysis, dipstick only (Completed)   Screening for prostate cancer       Relevant Orders   PSA (Completed)   Need for pneumococcal vaccination       Relevant Orders   Pneumococcal conjugate vaccine 13-valent IM (Completed)       Return in about 1 year (around 03/22/2018) for Annual Exam.   Fernande Bras, PA-C [facility]

## 2017-03-22 NOTE — Progress Notes (Signed)
Subjective:    Patient ID: Mark Cherry, male    DOB: January 06, 1956, 62 y.o.   MRN: [id number]  Chief Complaint  Patient presents with  . Annual Exam   New patient to the office. Last annual exam was 02/25/2015. He had a positive fecal occult test and was referred to get colonoscopy, but he has yet to get one. LDL was elevated at 137 on 03/18/14.  Colorectal Cancer Screening: No colonoscopy. Prostate Cancer Screening: PSA level within normal limits 03/18/14 Seasonal Influenza Vaccination: Last flu vaccine in 1999 or 2000 - "got sick for two weeks after" Pneumococcal Vaccination: 02/25/15 Frequency of Eye evaluation: Over 1 year ago Frequency of Dental evaluation: "It has been awhile" Had insurance difficulties previously, but will make an appointment soon  Patient is a current smoker. He smokes 1/2 pack a day for 35 years. He is thinking about quitting, but it is difficult due to his work environment.   Current diet: meat and cooked greens (turnip, collards), only a small amount of breads. He stays active at work.  Review of Systems  Constitutional: Negative for appetite change, chills, fatigue, fever and unexpected weight change.  HENT: Positive for congestion.   Eyes: Positive for itching.  Respiratory: Negative for chest tightness and shortness of breath.   Cardiovascular: Negative for chest pain and palpitations.  Gastrointestinal: Negative for abdominal pain, constipation, diarrhea, nausea and vomiting.  Genitourinary: Negative for difficulty urinating, penile pain, penile swelling, scrotal swelling and testicular pain.  Neurological: Negative for dizziness, numbness and headaches.   Patient Active Problem List   Diagnosis Date Noted  . Occult blood in stools 03/22/2017  . Smoker 03/22/2017  . Hypercholesterolemia 04/25/2016  . Complete tear of right rotator cuff 07/28/2015  . Perennial allergic rhinitis with seasonal variation 11/18/2014   Prior to Admission medications    Medication Sig Start Date End Date Taking? Authorizing Provider  fluticasone (FLONASE) 50 MCG/ACT nasal spray 1 spray by Each Nare route daily.   Yes [provider]   Allergies  Allergen Reactions  . Cat Hair Extract Itching      Objective:   Physical Exam  Constitutional: He is oriented to person, place, and time. He appears well-nourished.  HENT:  Head: Normocephalic.  Eyes: Conjunctivae are normal.  Neck: No JVD present. No thyromegaly present.  Cardiovascular: Normal rate, regular rhythm, normal heart sounds and intact distal pulses.  Pulses:      Radial pulses are 2+ on the right side, and 2+ on the left side.       Posterior tibial pulses are 2+ on the right side, and 2+ on the left side.  Pulmonary/Chest: Effort normal and breath sounds normal.  Abdominal: Bowel sounds are normal.  Musculoskeletal: He exhibits no edema.  Lymphadenopathy:    He has no cervical adenopathy.  Neurological: He is alert and oriented to person, place, and time. He has normal reflexes.  Psychiatric: He has a normal mood and affect. His behavior is normal.    Visual Acuity Screening   Right eye Left eye Both eyes  Without correction: 20/25 20/40 20/20   With correction:         Assessment & Plan:  1. Annual physical exam  2. Screening for deficiency anemia - CBC with Differential/Platelet  3. Need for hepatitis C screening test - Hepatitis C antibody  4. Screening for HIV (human immunodeficiency virus) - HIV antibody  5. Screening for metabolic disorder - Comprehensive metabolic panel  6. Screening for thyroid disorder -  TSH  7. Screening for blood or protein in urine - Urinalysis, dipstick only  8. Screening for prostate cancer - PSA  9. Occult blood in stools - Ambulatory referral to Gastroenterology  10. Smoker Discussed the importance of quitting. When he is ready, we are available to help.   11. Hypercholesterolemia - Lipid panel  12. Need for  pneumococcal vaccination - Pneumococcal conjugate vaccine 13-valent IM  Return in about 1 year (around 03/22/2018) for Annual Exam.   Alfonse Alpersaroline Bailie Christenbury, PA-S

## 2017-03-22 NOTE — Patient Instructions (Addendum)
Stay hydrated by drinking lots of water. Natural Tears are helpful for dry eye symptoms. Avoid any allergy eye drops.       IF you received an x-ray today, you will receive an invoice from [facility]. Please contact [facility] at [phone] with questions or concerns regarding your invoice.   IF you received labwork today, you will receive an invoice from [facility]. Please contact [facility] at [phone] with questions or concerns regarding your invoice.   Our billing staff will not be able to assist you with questions regarding bills from these companies.  You will be contacted with the lab results as soon as they are available. The fastest way to get your results is to activate your My Chart account. Instructions are located on the last page of this paperwork. If you have not heard from [place] regarding the results in 2 weeks, please contact this office.    Did you know that you begin to benefit from quitting smoking within the first twenty minutes? It's TRUE.  At 20 minutes: -blood pressure decreases -pulse rate drops -body temperature of hands and feet increases  At 8 hours: -carbon monoxide level in blood drops to normal -oxygen level in blood increases to normal  At 24 hours: -the chance of heart attack decreases  At 48 hours: -nerve endings start regrowing -ability to smell and taste is enhanced  2 weeks-3 months: -circulation improves -walking becomes easier -lung function improves  1-9 months: -coughing, sinus congestion, fatigue and shortness of breath decreases  1 year: -excess risk of heart disease is decreased to HALF that of a smoker  5 years: Stroke risk is reduced to that of people who have never smoked  10 years: -risk of lung cancer drops to as little as half that of continuing smokers -risk of cancer of the mouth, throat, esophagus, bladder, kidney and pancreas decreases -risk of ulcer decreases  15 years -risk of heart  disease is now similar to that of people who have never smoked -risk of death returns to nearly the level of people who have never smoked    Preventive Care 37-64 Years, Male Preventive care refers to lifestyle choices and visits with your health care provider that can promote health and wellness. What does preventive care include?  A yearly physical exam. This is also called an annual well check.  Dental exams once or twice a year.  Routine eye exams. Ask your health care provider how often you should have your eyes checked.  Personal lifestyle choices, including: ? Daily care of your teeth and gums. ? Regular physical activity. ? Eating a healthy diet. ? Avoiding tobacco and drug use. ? Limiting alcohol use. ? Practicing safe sex. ? Taking low-dose aspirin every day starting at age 50. What happens during an annual well check? The services and screenings done by your health care provider during your annual well check will depend on your age, overall health, lifestyle risk factors, and family history of disease. Counseling Your health care provider may ask you questions about your:  Alcohol use.  Tobacco use.  Drug use.  Emotional well-being.  Home and relationship well-being.  Sexual activity.  Eating habits.  Work and work Statistician.  Screening You may have the following tests or measurements:  Height, weight, and BMI.  Blood pressure.  Lipid and cholesterol levels. These may be checked every 5 years, or more frequently if you are over 44 years old.  Skin check.  Lung cancer screening. You may have  this screening every year starting at age 58 if you have a 30-pack-year history of smoking and currently smoke or have quit within the past 15 years.  Fecal occult blood test (FOBT) of the stool. You may have this test every year starting at age 63.  Flexible sigmoidoscopy or colonoscopy. You may have a sigmoidoscopy every 5 years or a colonoscopy every 10  years starting at age 25.  Prostate cancer screening. Recommendations will vary depending on your family history and other risks.  Hepatitis C blood test.  Hepatitis B blood test.  Sexually transmitted disease (STD) testing.  Diabetes screening. This is done by checking your blood sugar (glucose) after you have not eaten for a while (fasting). You may have this done every 1-3 years.  Discuss your test results, treatment options, and if necessary, the need for more tests with your health care provider. Vaccines Your health care provider may recommend certain vaccines, such as:  Influenza vaccine. This is recommended every year.  Tetanus, diphtheria, and acellular pertussis (Tdap, Td) vaccine. You may need a Td booster every 10 years.  Varicella vaccine. You may need this if you have not been vaccinated.  Zoster vaccine. You may need this after age 63.  Measles, mumps, and rubella (MMR) vaccine. You may need at least one dose of MMR if you were born in 1957 or later. You may also need a second dose.  Pneumococcal 13-valent conjugate (PCV13) vaccine. You may need this if you have certain conditions and have not been vaccinated.  Pneumococcal polysaccharide (PPSV23) vaccine. You may need one or two doses if you smoke cigarettes or if you have certain conditions.  Meningococcal vaccine. You may need this if you have certain conditions.  Hepatitis A vaccine. You may need this if you have certain conditions or if you travel or work in places where you may be exposed to hepatitis A.  Hepatitis B vaccine. You may need this if you have certain conditions or if you travel or work in places where you may be exposed to hepatitis B.  Haemophilus influenzae type b (Hib) vaccine. You may need this if you have certain risk factors.  Talk to your health care provider about which screenings and vaccines you need and how often you need them. This information is not intended to replace advice given  to you by your health care provider. Make sure you discuss any questions you have with your health care provider. Document Released: 03/21/2015 Document Revised: 11/12/2015 Document Reviewed: 12/24/2014 Elsevier Interactive Patient Education  [employer].

## 2017-03-23 LAB — CBC WITH DIFFERENTIAL/PLATELET
BASOS ABS: 0 10*3/uL (ref 0.0–0.2)
Basos: 0 %
EOS (ABSOLUTE): 0.1 10*3/uL (ref 0.0–0.4)
EOS: 2 %
HEMOGLOBIN: 14.5 g/dL (ref 13.0–17.7)
Hematocrit: 43.9 % (ref 37.5–51.0)
IMMATURE GRANULOCYTES: 0 %
Immature Grans (Abs): 0 10*3/uL (ref 0.0–0.1)
LYMPHS ABS: 2.6 10*3/uL (ref 0.7–3.1)
Lymphs: 36 %
MCH: 29.5 pg (ref 26.6–33.0)
MCHC: 33 g/dL (ref 31.5–35.7)
MCV: 89 fL (ref 79–97)
MONOCYTES: 6 %
Monocytes Absolute: 0.4 10*3/uL (ref 0.1–0.9)
NEUTROS PCT: 56 %
Neutrophils Absolute: 4.1 10*3/uL (ref 1.4–7.0)
Platelets: 218 10*3/uL (ref 150–379)
RBC: 4.92 x10E6/uL (ref 4.14–5.80)
RDW: 15.3 % (ref 12.3–15.4)
WBC: 7.3 10*3/uL (ref 3.4–10.8)

## 2017-03-23 LAB — COMPREHENSIVE METABOLIC PANEL
ALBUMIN: 4.8 g/dL (ref 3.5–5.5)
ALK PHOS: 27 IU/L — AB (ref 39–117)
ALT: 27 IU/L (ref 0–44)
AST: 32 IU/L (ref 0–40)
Albumin/Globulin Ratio: 1.8 (ref 1.2–2.2)
BUN/Creatinine Ratio: 10 (ref 9–20)
BUN: 12 mg/dL (ref 6–24)
Bilirubin Total: 0.3 mg/dL (ref 0.0–1.2)
CALCIUM: 9.7 mg/dL (ref 8.7–10.2)
CO2: 20 mmol/L (ref 20–29)
CREATININE: 1.15 mg/dL (ref 0.76–1.27)
Chloride: 102 mmol/L (ref 96–106)
GFR calc Af Amer: 80 mL/min/{1.73_m2} (ref >59)
GFR, EST NON AFRICAN AMERICAN: 69 mL/min/{1.73_m2} (ref >59)
Globulin, Total: 2.6 g/dL (ref 1.5–4.5)
Glucose: 91 mg/dL (ref 65–99)
Potassium: 4.4 mmol/L (ref 3.5–5.2)
Sodium: 137 mmol/L (ref 134–144)
Total Protein: 7.4 g/dL (ref 6.0–8.5)

## 2017-03-23 LAB — URINALYSIS, DIPSTICK ONLY
Bilirubin, UA: NEGATIVE
Glucose, UA: NEGATIVE
Ketones, UA: NEGATIVE
Leukocytes, UA: NEGATIVE
Nitrite, UA: NEGATIVE
Protein, UA: NEGATIVE
RBC, UA: NEGATIVE
Specific Gravity, UA: 1.014 (ref 1.005–1.030)
Urobilinogen, Ur: 0.2 mg/dL (ref 0.2–1.0)
pH, UA: 6.5 (ref 5.0–7.5)

## 2017-03-23 LAB — LIPID PANEL
Chol/HDL Ratio: 3.9 ratio (ref 0.0–5.0)
Cholesterol, Total: 205 mg/dL — ABNORMAL HIGH (ref 100–199)
HDL: 53 mg/dL
LDL Calculated: 136 mg/dL — ABNORMAL HIGH (ref 0–99)
Triglycerides: 78 mg/dL (ref 0–149)
VLDL Cholesterol Cal: 16 mg/dL (ref 5–40)

## 2017-03-23 LAB — HEPATITIS C ANTIBODY: Hep C Virus Ab: <0.1 {s_co_ratio} (ref 0.0–0.9)

## 2017-03-23 LAB — PSA: Prostate Specific Ag, Serum: 0.7 ng/mL (ref 0.0–4.0)

## 2017-03-23 LAB — TSH: TSH: 2.75 u[IU]/mL (ref 0.450–4.500)

## 2017-03-23 LAB — HIV ANTIBODY (ROUTINE TESTING W REFLEX): HIV SCREEN 4TH GENERATION: NONREACTIVE

## 2017-04-03 ENCOUNTER — Encounter: Payer: Self-pay | Admitting: Physician Assistant

## 2017-04-03 NOTE — Assessment & Plan Note (Signed)
LDL was 137 in 2016. Update today.

## 2017-04-03 NOTE — Assessment & Plan Note (Signed)
Stressed the importance of colonoscopy to further evaluate history of heme positive stools. Referral to GI.

## 2017-04-03 NOTE — Assessment & Plan Note (Signed)
Smoking cessation encouraged. He is not ready.

## 2017-05-24 ENCOUNTER — Encounter: Payer: Self-pay | Admitting: Physician Assistant

## 2017-06-13 ENCOUNTER — Encounter: Payer: Self-pay | Admitting: Physician Assistant

## 2019-07-02 ENCOUNTER — Emergency Department (HOSPITAL_BASED_OUTPATIENT_CLINIC_OR_DEPARTMENT_OTHER): Payer: 59

## 2019-07-02 ENCOUNTER — Other Ambulatory Visit: Payer: Self-pay

## 2019-07-02 ENCOUNTER — Encounter (HOSPITAL_BASED_OUTPATIENT_CLINIC_OR_DEPARTMENT_OTHER): Payer: Self-pay

## 2019-07-02 ENCOUNTER — Emergency Department (HOSPITAL_BASED_OUTPATIENT_CLINIC_OR_DEPARTMENT_OTHER)
Admission: EM | Admit: 2019-07-02 | Discharge: 2019-07-02 | Disposition: A | Discharge status: Home / Self Care | Payer: 59 | Attending: Emergency Medicine | Admitting: Emergency Medicine

## 2019-07-02 DIAGNOSIS — F17210 Nicotine dependence, cigarettes, uncomplicated: Secondary | ICD-10-CM | POA: Insufficient documentation

## 2019-07-02 DIAGNOSIS — R1032 Left lower quadrant pain: Secondary | ICD-10-CM | POA: Diagnosis present

## 2019-07-02 DIAGNOSIS — T148XXA Other injury of unspecified body region, initial encounter: Secondary | ICD-10-CM

## 2019-07-02 LAB — URINALYSIS, ROUTINE W REFLEX MICROSCOPIC
Bilirubin Urine: NEGATIVE
Glucose, UA: NEGATIVE mg/dL
Hgb urine dipstick: NEGATIVE
Ketones, ur: NEGATIVE mg/dL
Leukocytes,Ua: NEGATIVE
Nitrite: NEGATIVE
Protein, ur: NEGATIVE mg/dL
Specific Gravity, Urine: 1.02 (ref 1.005–1.030)
pH: 6 (ref 5.0–8.0)

## 2019-07-02 LAB — BASIC METABOLIC PANEL
Anion gap: 9 (ref 5–15)
BUN: 12 mg/dL (ref 8–23)
CO2: 25 mmol/L (ref 22–32)
Calcium: 9.1 mg/dL (ref 8.9–10.3)
Chloride: 105 mmol/L (ref 98–111)
Creatinine, Ser: 0.95 mg/dL (ref 0.61–1.24)
GFR calc Af Amer: >60 mL/min (ref >60)
GFR calc non Af Amer: >60 mL/min (ref >60)
Glucose, Bld: 97 mg/dL (ref 70–99)
Potassium: 4.1 mmol/L (ref 3.5–5.1)
Sodium: 139 mmol/L (ref 135–145)

## 2019-07-02 LAB — CBC
HCT: 42.2 % (ref 39.0–52.0)
Hemoglobin: 14.6 g/dL (ref 13.0–17.0)
MCH: 29.9 pg (ref 26.0–34.0)
MCHC: 34.6 g/dL (ref 30.0–36.0)
MCV: 86.5 fL (ref 80.0–100.0)
Platelets: 224 10*3/uL (ref 150–400)
RBC: 4.88 MIL/uL (ref 4.22–5.81)
RDW: 13.7 % (ref 11.5–15.5)
WBC: 5.9 10*3/uL (ref 4.0–10.5)
nRBC: 0 % (ref 0.0–0.2)

## 2019-07-02 MED ORDER — CYCLOBENZAPRINE HCL 10 MG PO TABS: 10.0000 mg | ORAL_TABLET | Freq: Two times a day (BID) | ORAL | 0 refills | Status: AC | PRN

## 2019-07-02 NOTE — Discharge Instructions (Addendum)
You were evaluated in the Emergency Department and after careful evaluation, we did not find any emergent condition requiring admission or further testing in the hospital.  Your exam/testing today was overall reassuring.  Symptoms seem to be due to continued pain from muscle strain or spasm.  We recommend Tylenol 1000 mg every 4-6 hours as well as Motrin 600 mg every 4-6 hours.  You can try different muscle relaxer such as Flexeril for your pain.  Please return to the Emergency Department if you experience any worsening of your condition.  We encourage you to follow up with a primary care provider.  Thank you for allowing [place] to be a part of your care.

## 2019-07-02 NOTE — ED Provider Notes (Signed)
[facility] Emergency Department Provider Note MRN:  [id number]  Arrival date & time: 07/02/19     Chief Complaint   Back Pain   History of Present Illness   Mark Cherry is a 64 y.o. year-old male with no pertinent past medical history presenting to the ED with chief complaint of flank pain.  Location: Left flank Duration: 2 or 3 days Onset: Suddenly noticed it when he got up in the morning Timing: Constant pain Description: Sharp Severity: Moderate Exacerbating/Alleviating Factors: Worse with some movements Associated Symptoms: None Pertinent Negatives: Denies hematuria or dysuria, no bowel or bladder dysfunction, no numbness or weakness.   Review of Systems  A complete 10 system review of systems was obtained and all systems are negative except as noted in the HPI and PMH.   Patient's Health History    Past Medical History:  Diagnosis Date  . Allergy     Past Surgical History:  Procedure Laterality Date  . SHOULDER SURGERY Left 1986   reconstruction after traumatic injury    Family History  Problem Relation Age of Onset  . Diabetes Mother   . Diabetes Brother   . Hyperlipidemia Brother   . Gout Brother   . Hypertension Brother   . Diabetes Maternal Grandfather   . Sickle cell anemia Sister   . Arthritis Brother   . Arthritis Brother   . Sickle cell anemia Brother   . Sickle cell anemia Sister     Social History   Socioeconomic History  . Marital status: Married    Spouse name: Darrick Penna  . Number of children: 4  . Years of education: 12th grade  . Highest education level: High school graduate  Occupational History  . Occupation: Freight forwarder  Tobacco Use  . Smoking status: Current Every Day Smoker    Packs/day: 0.50    Years: 30.00    Pack years: 15.00    Types: Cigarettes  . Smokeless tobacco: Never Used  . Tobacco comment: "I've been working on it, but it's the job I have."  Substance and Sexual Activity    . Alcohol use: Yes    Alcohol/week: 3.0 standard drinks    Types: 3 Standard drinks or equivalent per week  . Drug use: No  . Sexual activity: Yes    Partners: Female  Other Topics Concern  . Not on file  Social History Narrative   Lives with his fiance.   His younger son lives with him sometimes/intermittently.   Eldest son is currently incarcerated.   One daughter lives with her mother.   One daughter independently in [facility].   He has 7 grandchildren.   Social Determinants of Health   Financial Resource Strain:   . Difficulty of Paying Living Expenses:   Food Insecurity:   . Worried About Charity fundraiser in the Last Year:   . Arboriculturist in the Last Year:   Transportation Needs:   . Film/video editor (Medical):   Marland Kitchen Lack of Transportation (Non-Medical):   Physical Activity:   . Days of Exercise per Week:   . Minutes of Exercise per Session:   Stress:   . Feeling of Stress :   Social Connections:   . Frequency of Communication with Friends and Family:   . Frequency of Social Gatherings with Friends and Family:   . Attends Religious Services:   . Active Member of Clubs or Organizations:   . Attends Archivist Meetings:   .  Marital Status:   Intimate Partner Violence:   . Fear of Current or Ex-Partner:   . Emotionally Abused:   Marland Kitchen Physically Abused:   . Sexually Abused:      Physical Exam   Vitals:   07/02/19 0948  BP: (!) 151/99  Pulse: 60  Resp: 18  Temp: 98.1 F (36.7 C)  SpO2: 100%    CONSTITUTIONAL: Well-appearing, NAD NEURO:  Alert and oriented x 3, no focal deficits EYES:  eyes equal and reactive ENT/NECK:  no LAD, no JVD CARDIO: Regular rate, well-perfused, normal S1 and S2 PULM:  CTAB no wheezing or rhonchi GI/GU:  normal bowel sounds, non-distended, non-tender MSK/SPINE:  No gross deformities, no edema SKIN:  no rash, atraumatic PSYCH:  Appropriate speech and behavior  *Additional and/or pertinent findings  included in MDM below  Diagnostic and Interventional Summary    EKG Interpretation  Date/Time:    Ventricular Rate:    PR Interval:    QRS Duration:   QT Interval:    QTC Calculation:   R Axis:     Text Interpretation:        Labs Reviewed  CBC  BASIC METABOLIC PANEL  URINALYSIS, ROUTINE W REFLEX MICROSCOPIC    CT RENAL STONE STUDY  Final Result      Medications - No data to display   Procedures  /  Critical Care Procedures  ED Course and Medical Decision Making  I have reviewed the triage vital signs, the nursing notes, and pertinent available records from the EMR.  Listed above are laboratory and imaging tests that I personally ordered, reviewed, and interpreted and then considered in my medical decision making (see below for details).      Continued left flank pain despite muscle relaxers from urgent care, will obtain CT imaging to exclude stone or aortic aneurysm  CT is unremarkable, work-up reassuring, patient appropriate for discharge with continued symptomatic management.  Elmer Sow. Pilar Plate, MD [facility] Emergency Medicine [facility] [email or web address]  Final Clinical Impressions(s) / ED Diagnoses     ICD-10-CM   1. Muscle strain  [email or web address]     ED Discharge Orders         Ordered    cyclobenzaprine (FLEXERIL) 10 MG tablet  2 times daily PRN     07/02/19 1115           Discharge Instructions Discussed with and Provided to Patient:     Discharge Instructions     You were evaluated in the Emergency Department and after careful evaluation, we did not find any emergent condition requiring admission or further testing in the hospital.  Your exam/testing today was overall reassuring.  Symptoms seem to be due to continued pain from muscle strain or spasm.  We recommend Tylenol 1000 mg every 4-6 hours as well as Motrin 600 mg every 4-6 hours.  You can try different muscle relaxer such as Flexeril for your pain.  Please return to  the Emergency Department if you experience any worsening of your condition.  We encourage you to follow up with a primary care provider.  Thank you for allowing [place] to be a part of your care.        Sabas Sous, MD 07/02/19 1116

## 2019-07-02 NOTE — ED Triage Notes (Signed)
Pt c/o left sided back pain starting Wednesday night into Thursday morning. Pt reports treating himself as if he had gas. Was seen at [facility] on Saturday, told he had a pulled muscle, given meds at [facility], pt reports medication is not helping. Also reports having second Covid shot last Sunday. Denies any urinary symptoms.

## 2021-02-04 ENCOUNTER — Emergency Department (HOSPITAL_BASED_OUTPATIENT_CLINIC_OR_DEPARTMENT_OTHER)
Admission: EM | Admit: 2021-02-04 | Discharge: 2021-02-04 | Disposition: A | Discharge status: Home / Self Care | Payer: 59 | Attending: Student | Admitting: Student

## 2021-02-04 ENCOUNTER — Other Ambulatory Visit: Payer: Self-pay

## 2021-02-04 ENCOUNTER — Encounter (HOSPITAL_BASED_OUTPATIENT_CLINIC_OR_DEPARTMENT_OTHER): Payer: Self-pay

## 2021-02-04 DIAGNOSIS — Z20822 Contact with and (suspected) exposure to covid-19: Secondary | ICD-10-CM | POA: Diagnosis not present

## 2021-02-04 DIAGNOSIS — F17210 Nicotine dependence, cigarettes, uncomplicated: Secondary | ICD-10-CM | POA: Diagnosis not present

## 2021-02-04 DIAGNOSIS — J029 Acute pharyngitis, unspecified: Secondary | ICD-10-CM | POA: Diagnosis not present

## 2021-02-04 LAB — RESP PANEL BY RT-PCR (FLU A&B, COVID) ARPGX2
Influenza A by PCR: NEGATIVE
Influenza B by PCR: NEGATIVE
SARS Coronavirus 2 by RT PCR: NEGATIVE

## 2021-02-04 LAB — GROUP A STREP BY PCR: Group A Strep by PCR: NOT DETECTED

## 2021-02-04 NOTE — ED Triage Notes (Signed)
Pt c/o sore throat started last night-NAD-steady gait 

## 2021-02-04 NOTE — ED Provider Notes (Signed)
[facility] EMERGENCY DEPARTMENT Provider Note   CSN: [id number] Arrival date & time: 02/04/21  1330     History Chief Complaint  Patient presents with   Sore Throat    Mark Cherry is a 65 y.o. male who presents to the ED today with complaint of gradual onset, constant, achy, sore throat that began this morning. Pt denies any associated symptoms including fevers, chills, ear pain, cough, difficulty swallowing, drooling, body aches, or any other associated symptoms. He mentions working with a lot of dust yesterday and is unsure if this could be causing symptoms however mentions symptoms feel similar to previous strep throat many years ago. He has not taken anything for his symptoms. No other complaints at this time.   The history is provided by the patient and medical records.      Past Medical History:  Diagnosis Date   Allergy     Patient Active Problem List   Diagnosis Date Noted   Occult blood in stools 03/22/2017   Smoker 03/22/2017   Hypercholesterolemia 04/25/2016   Complete tear of right rotator cuff 07/28/2015   Perennial allergic rhinitis with seasonal variation 11/18/2014    Past Surgical History:  Procedure Laterality Date   SHOULDER SURGERY Left 1986   reconstruction after traumatic injury       Family History  Problem Relation Age of Onset   Diabetes Mother    Diabetes Brother    Hyperlipidemia Brother    Gout Brother    Hypertension Brother    Diabetes Maternal Grandfather    Sickle cell anemia Sister    Arthritis Brother    Arthritis Brother    Sickle cell anemia Brother    Sickle cell anemia Sister     Social History   Tobacco Use   Smoking status: Every Day    Packs/day: 0.50    Years: 30.00    Pack years: 15.00    Types: Cigarettes   Smokeless tobacco: Never  Vaping Use   Vaping Use: Never used  Substance Use Topics   Alcohol use: Yes    Alcohol/week: 3.0 standard drinks    Types: 3 Standard drinks or equivalent  per week    Comment: occ   Drug use: No    Home Medications Prior to Admission medications   Medication Sig Start Date End Date Taking? Authorizing Provider  cyclobenzaprine (FLEXERIL) 10 MG tablet Take 1 tablet (10 mg total) by mouth 2 (two) times daily as needed for muscle spasms. 07/02/19   Sabas Sous, MD  fluticasone (FLONASE) 50 MCG/ACT nasal spray 1 spray by Each Nare route daily.    [provider]    Allergies    Cat hair extract and Dust mite extract  Review of Systems   Review of Systems  Constitutional:  Negative for chills and fever.  HENT:  Positive for sore throat. Negative for ear pain, trouble swallowing and voice change.   Respiratory:  Negative for cough and shortness of breath.   Musculoskeletal:  Negative for myalgias.  All other systems reviewed and are negative.  Physical Exam Updated Vital Signs BP 139/78 (BP Location: Left Arm)   Pulse (!) 58   Temp 98.5 F (36.9 C) (Oral)   Resp 18   Ht 5\' 7"  (1.702 m)   Wt 77.1 kg   SpO2 99%   BMI 26.63 kg/m   Physical Exam Vitals and nursing note reviewed.  Constitutional:      Appearance: He is not ill-appearing  or diaphoretic.  HENT:     Head: Normocephalic and atraumatic.     Mouth/Throat:     Mouth: Mucous membranes are moist.     Pharynx: Oropharynx is clear. Uvula midline. No pharyngeal swelling, oropharyngeal exudate or posterior oropharyngeal erythema.  Eyes:     Conjunctiva/sclera: Conjunctivae normal.  Cardiovascular:     Rate and Rhythm: Normal rate and regular rhythm.  Pulmonary:     Effort: Pulmonary effort is normal.     Breath sounds: Normal breath sounds. No wheezing, rhonchi or rales.  Skin:    General: Skin is warm and dry.     Coloration: Skin is not jaundiced.  Neurological:     Mental Status: He is alert.    ED Results / Procedures / Treatments   Labs (all labs ordered are listed, but only abnormal results are displayed) Labs Reviewed  GROUP A STREP BY PCR   RESP PANEL BY RT-PCR (FLU A&B, COVID) [id number]    EKG None  Radiology No results found.  Procedures Procedures   Medications Ordered in ED Medications - No data to display  ED Course  I have reviewed the triage vital signs and the nursing notes.  Pertinent labs & imaging results that were available during my care of the patient were reviewed by me and considered in my medical decision making (see chart for details).    MDM Rules/Calculators/A&P                           65 year old male who presents to the ED today with complaint of sore throat that began earlier this morning.  On arrival to the ED today vitals are stable.  Patient appears to be no acute distress.  He had a strep test done prior to being seen which has returned negative.  On my exam he does not have any posterior oropharyngeal erythema or edema.  Uvula is midline.  Tolerating secretions without examining normally.  Does mention working with dust yesterday, question if this could be causing some irritation however in the setting of flu season we will plan to test for COVID and flu.  If negative we will plan to discharge home with likely viral pharyngitis versus irritation related to dust exposure.  Patient will need to follow-up with PCP.   COVID and flu negative Pt stable for discharge home at this time. Have recommend OTC Ibuprofen and Tylenol PRN for pain as well as cough drops for throat discomfort. Pt instructed to follow up with PCP for further eval and to return to the ED for any new/worsening symptoms. He is in agreement with plan.   This note was prepared using Dragon voice recognition software and may include unintentional dictation errors due to the inherent limitations of voice recognition software.  Final Clinical Impression(s) / ED Diagnoses Final diagnoses:  Sore throat    Rx / DC Orders ED Discharge Orders     None        Discharge Instructions      Your strep, COVID, and flu tests were  all negative today.  Please take Ibuprofen and Tylenol as needed for pain. I would also recommend cough drops for throat discomfort and to drink plenty of fluids to stay hydrated.   Follow up with your PCP for further evaluation  Return to the ED for any new/worsening symptoms including worsening pain, inability to swallow your own saliva, voice changes, fevers, or any other new/concerning symptoms  Tanda Rockers, PA-C 02/04/21 1630    [place], [place], MD 02/05/21 [phone]

## 2021-02-04 NOTE — Discharge Instructions (Signed)
Your strep, COVID, and flu tests were all negative today.  Please take Ibuprofen and Tylenol as needed for pain. I would also recommend cough drops for throat discomfort and to drink plenty of fluids to stay hydrated.   Follow up with your PCP for further evaluation  Return to the ED for any new/worsening symptoms including worsening pain, inability to swallow your own saliva, voice changes, fevers, or any other new/concerning symptoms

## 2021-03-02 IMAGING — CT CT RENAL STONE PROTOCOL
2 of 4 series · 16 of 46 positions shown, 18 images · non-contrast
Comparison: None.

CLINICAL DATA: Left flank pain

EXAM:
CT ABDOMEN AND PELVIS WITHOUT CONTRAST
TECHNIQUE: Multidetector CT imaging of the abdomen and pelvis was performed
following the standard protocol without IV contrast.

[Series 2: axial st · axial · 0.82mm/px · z∈[-521,-51]mm · 13 of 104 slices shown, 15 images]
[im 5/104  soft-tissue]
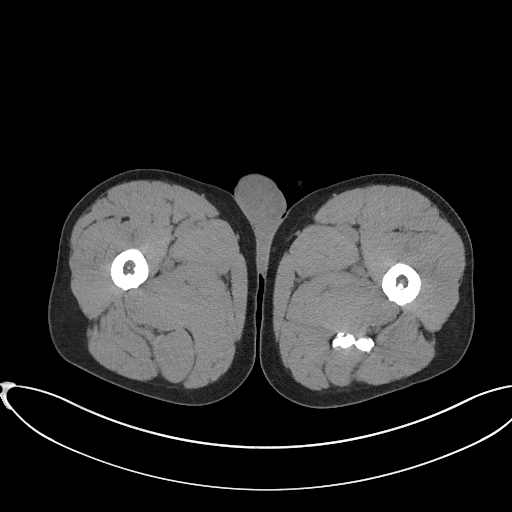
[im 5/104  bone]
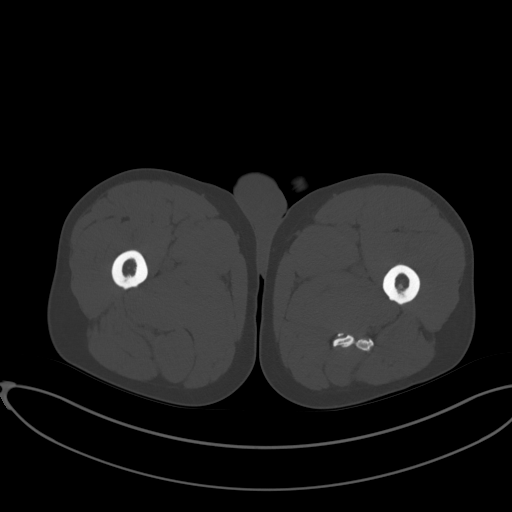
[im 13/104  soft-tissue]
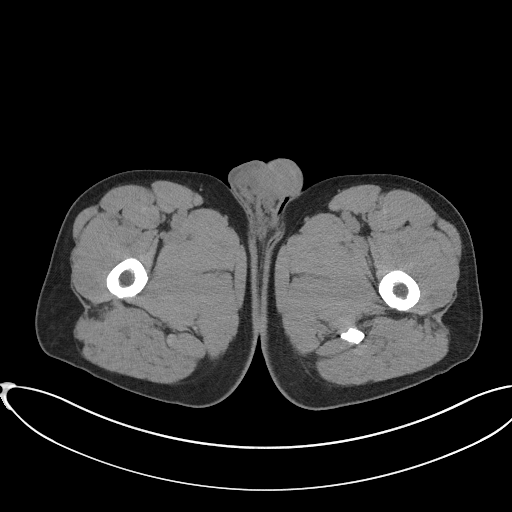
[im 22/104  soft-tissue]
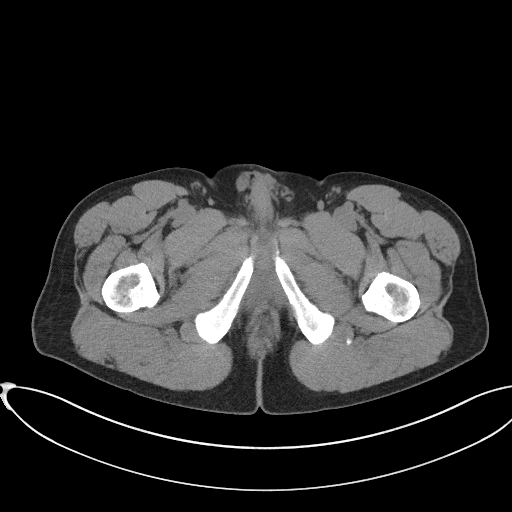
[im 31/104  soft-tissue]
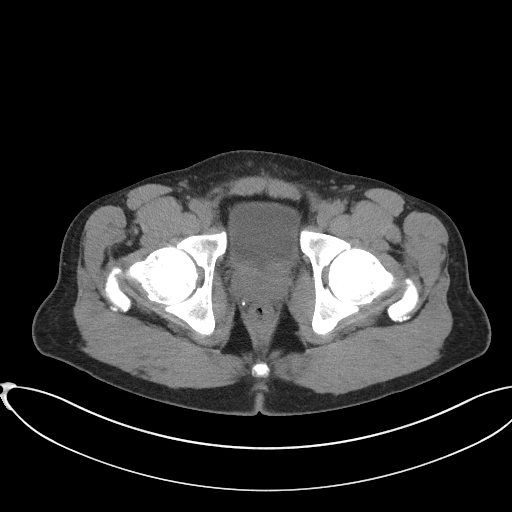
[im 35/104  soft-tissue]
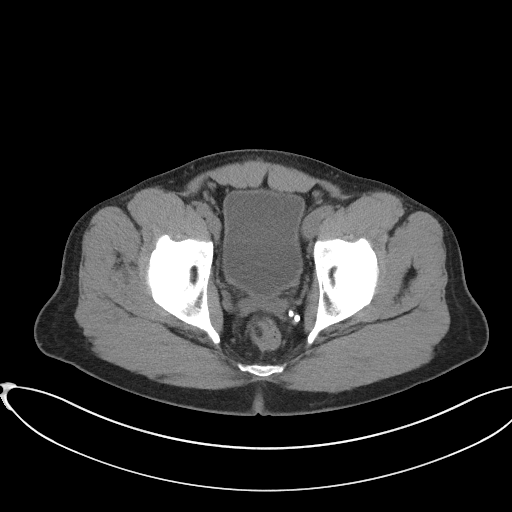
[im 43/104  soft-tissue]
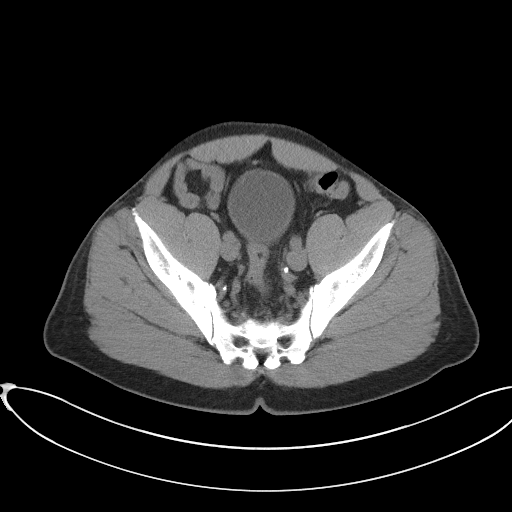
[im 52/104  soft-tissue]
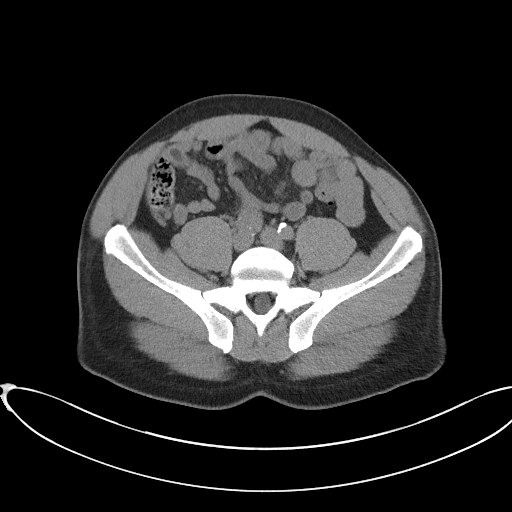
[im 61/104  soft-tissue]
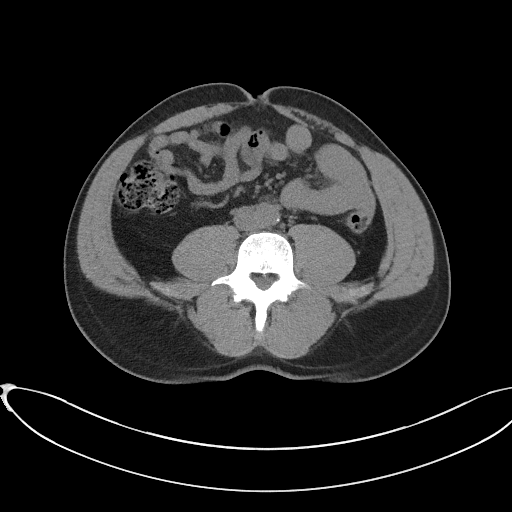
[im 69/104  soft-tissue]
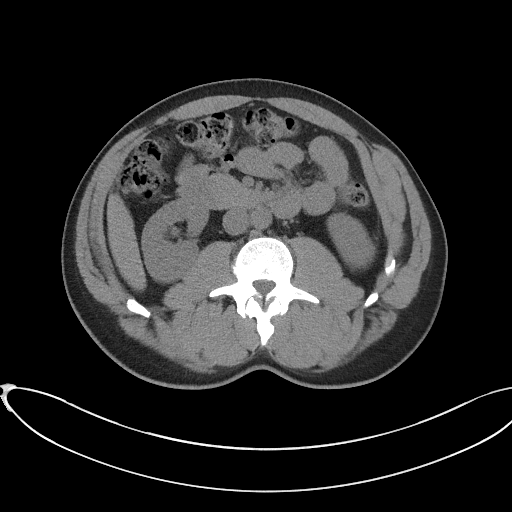
[im 69/104  bone]
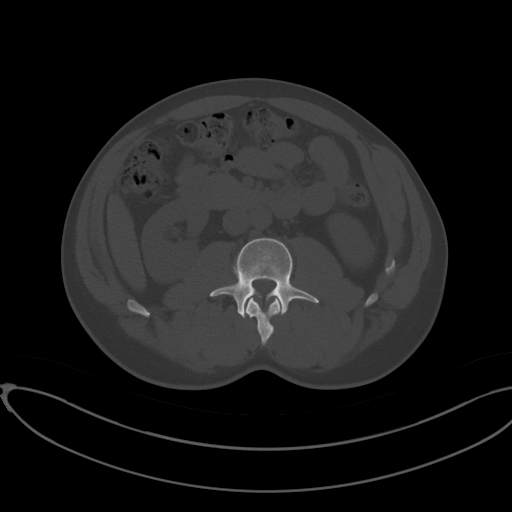
[im 73/104  soft-tissue]
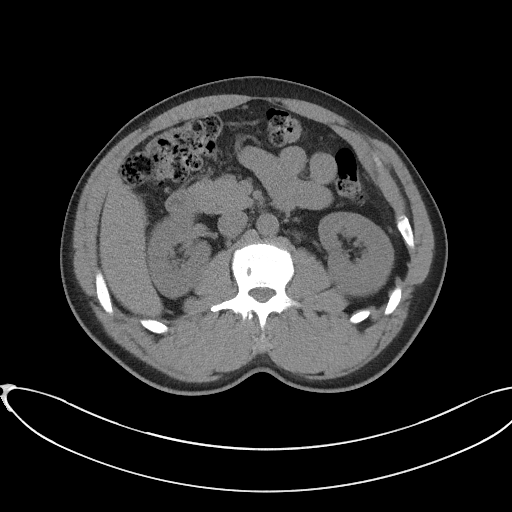
[im 82/104  soft-tissue]
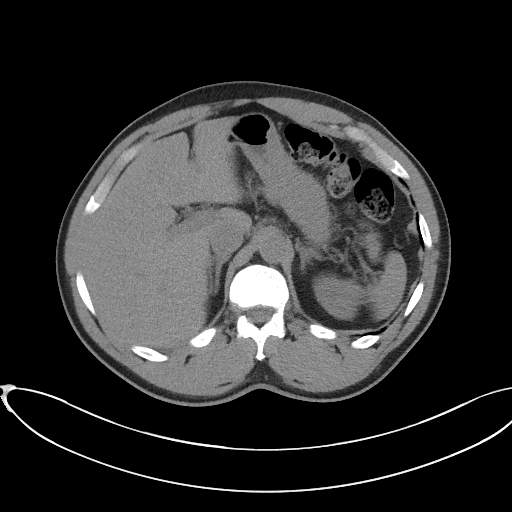
[im 91/104  soft-tissue]
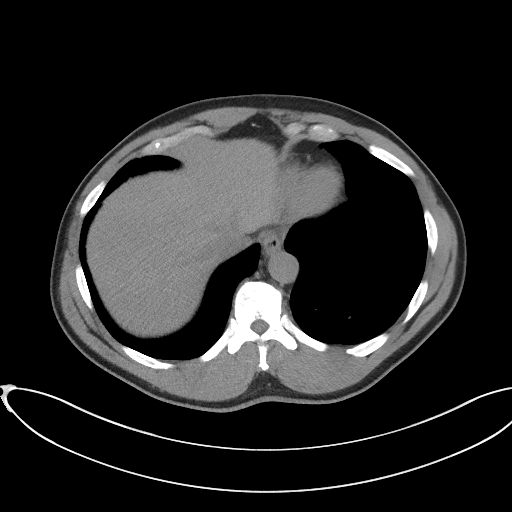
[im 99/104  soft-tissue]
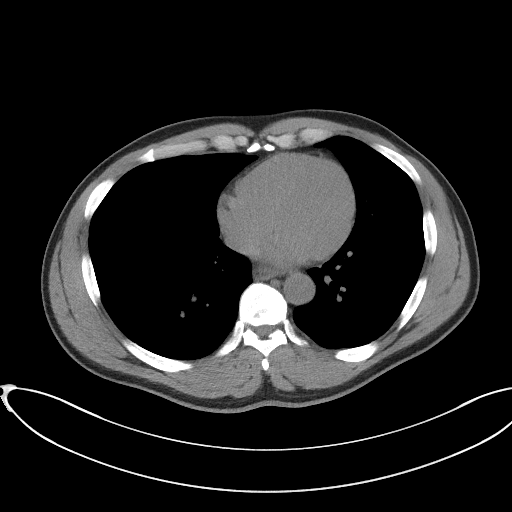

[Series 5: coronal st · coronal · 0.73mm/px · 3 of 95 slices shown]
[im 32/95  soft-tissue]
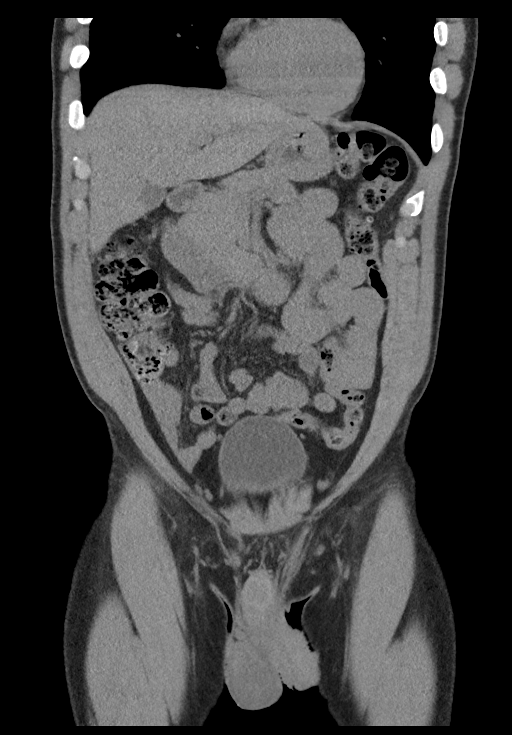
[im 42/95  soft-tissue]
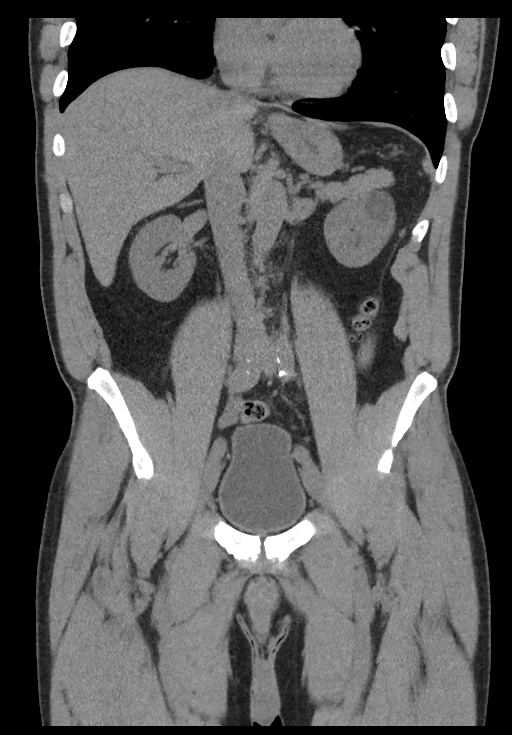
[im 53/95  soft-tissue]
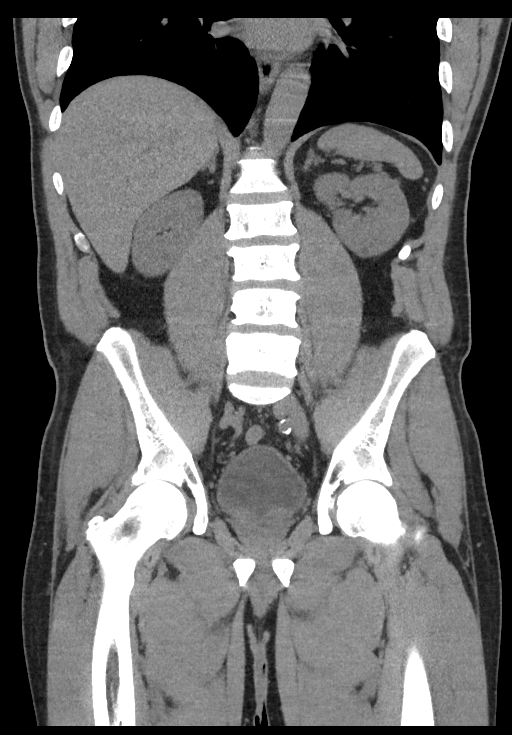

[16 of 46 positions shown; findings below may reference images not displayed]

FINDINGS: Lower chest: No acute abnormality.

Hepatobiliary: No focal liver abnormality is seen. No gallstones,
gallbladder wall thickening, or biliary dilatation.

Pancreas: Unremarkable.

Spleen: Unremarkable.

Adrenals/Urinary Tract: Adrenals are unremarkable. There is a small
cyst of the upper pole of the left kidney. Additional too small to
characterize hypoattenuating lesion of the lower pole of the right
kidney. There are no urinary tract calculi identified. Bladder is
unremarkable.

Stomach/Bowel: Stomach is within normal limits. Bowel is normal in
caliber. Normal appendix.

Vascular/Lymphatic: Mild aortic atherosclerosis. No enlarged lymph
nodes identified.

Reproductive: Unremarkable.

Other: No ascites.

Musculoskeletal: Mild lumbar spine degenerative changes.
IMPRESSION: No urinary tract calculi or other acute abnormality.
